# Patient Record
Sex: Female | Born: 1990 | Race: White | Hispanic: No | Marital: Single | Smoking: Current every day smoker
Health system: Southern US, Community
[De-identification: ages and names within clinical notes are randomized; demographics above are authoritative.]

## PROBLEM LIST (undated history)

## (undated) HISTORY — PX: CHOLECYSTECTOMY: SHX55

---
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
## 2010-03-04 ENCOUNTER — Emergency Department (HOSPITAL_COMMUNITY): Admission: EM | Admit: 2010-03-04 | Discharge: 2010-03-05 | Payer: Self-pay | Admitting: Emergency Medicine

## 2010-05-24 ENCOUNTER — Emergency Department (HOSPITAL_COMMUNITY): Admission: EM | Admit: 2010-05-24 | Discharge: 2010-05-24 | Payer: Self-pay | Admitting: Emergency Medicine

## 2010-09-03 ENCOUNTER — Encounter: Payer: Self-pay | Admitting: Family Medicine

## 2010-10-27 LAB — URINALYSIS, ROUTINE W REFLEX MICROSCOPIC
Hgb urine dipstick: NEGATIVE
Urobilinogen, UA: 0.2 mg/dL (ref 0.0–1.0)

## 2010-10-27 LAB — POCT PREGNANCY, URINE: Preg Test, Ur: NEGATIVE

## 2010-10-27 LAB — WET PREP, GENITAL
Clue Cells Wet Prep HPF POC: NONE SEEN
Trich, Wet Prep: NONE SEEN
Yeast Wet Prep HPF POC: NONE SEEN

## 2010-10-27 LAB — GC/CHLAMYDIA PROBE AMP, GENITAL: Chlamydia, DNA Probe: NEGATIVE

## 2010-10-29 LAB — URINALYSIS, ROUTINE W REFLEX MICROSCOPIC
Bilirubin Urine: NEGATIVE
Glucose, UA: NEGATIVE mg/dL
Leukocytes, UA: NEGATIVE
Nitrite: NEGATIVE
Protein, ur: NEGATIVE mg/dL
Specific Gravity, Urine: >1.03 — ABNORMAL HIGH (ref 1.005–1.030)
Urobilinogen, UA: 0.2 mg/dL (ref 0.0–1.0)
pH: 6 (ref 5.0–8.0)

## 2010-10-29 LAB — COMPREHENSIVE METABOLIC PANEL
ALT: 15 U/L (ref 0–35)
AST: 19 U/L (ref 0–37)
Albumin: 4 g/dL (ref 3.5–5.2)
Alkaline Phosphatase: 105 U/L (ref 39–117)
BUN: 5 mg/dL — ABNORMAL LOW (ref 6–23)
CO2: 27 mEq/L (ref 19–32)
Calcium: 9.3 mg/dL (ref 8.4–10.5)
Chloride: 103 mEq/L (ref 96–112)
Creatinine, Ser: 0.78 mg/dL (ref 0.4–1.2)
GFR calc Af Amer: >60 mL/min (ref >60)
GFR calc non Af Amer: >60 mL/min (ref >60)
Glucose, Bld: 86 mg/dL (ref 70–99)
Potassium: 3.7 mEq/L (ref 3.5–5.1)
Sodium: 137 mEq/L (ref 135–145)
Total Bilirubin: 0.4 mg/dL (ref 0.3–1.2)
Total Protein: 6.7 g/dL (ref 6.0–8.3)

## 2010-10-29 LAB — URINE MICROSCOPIC-ADD ON

## 2010-10-29 LAB — CBC
HCT: 38.7 % (ref 36.0–46.0)
MCHC: 34.6 g/dL (ref 30.0–36.0)
RBC: 4.31 MIL/uL (ref 3.87–5.11)

## 2010-10-29 LAB — DIFFERENTIAL
Basophils Absolute: 0.1 10*3/uL (ref 0.0–0.1)
Basophils Relative: 1 % (ref 0–1)
Eosinophils Absolute: 0.4 10*3/uL (ref 0.0–0.7)
Lymphocytes Relative: 32 % (ref 12–46)
Lymphs Abs: 3.6 10*3/uL (ref 0.7–4.0)
Monocytes Absolute: 0.6 10*3/uL (ref 0.1–1.0)
Neutro Abs: 6.6 10*3/uL (ref 1.7–7.7)

## 2010-10-29 LAB — POCT PREGNANCY, URINE: Preg Test, Ur: NEGATIVE

## 2010-10-29 LAB — LIPASE, BLOOD: Lipase: 24 U/L (ref 11–59)

## 2010-11-12 ENCOUNTER — Emergency Department (HOSPITAL_COMMUNITY)
Admission: EM | Admit: 2010-11-12 | Discharge: 2010-11-12 | Disposition: A | Discharge status: Home / Self Care | Payer: Medicaid Other | Attending: Emergency Medicine | Admitting: Emergency Medicine

## 2010-11-12 DIAGNOSIS — O209 Hemorrhage in early pregnancy, unspecified: Secondary | ICD-10-CM | POA: Insufficient documentation

## 2010-11-12 LAB — URINALYSIS, ROUTINE W REFLEX MICROSCOPIC
Bilirubin Urine: NEGATIVE
Glucose, UA: NEGATIVE mg/dL
Hgb urine dipstick: NEGATIVE
Ketones, ur: NEGATIVE mg/dL
Nitrite: NEGATIVE
Protein, ur: NEGATIVE mg/dL
Specific Gravity, Urine: <1.005 — ABNORMAL LOW (ref 1.005–1.030)
Urobilinogen, UA: 0.2 mg/dL (ref 0.0–1.0)
pH: 6 (ref 5.0–8.0)
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
## 2011-05-17 LAB — URINALYSIS, ROUTINE W REFLEX MICROSCOPIC
Glucose, UA: NEGATIVE
Hgb urine dipstick: NEGATIVE
Nitrite: NEGATIVE
Protein, ur: NEGATIVE

## 2011-05-17 LAB — BASIC METABOLIC PANEL
BUN: 11
CO2: 25
Creatinine, Ser: 0.77
Potassium: 3.8
Sodium: 135

## 2011-05-17 LAB — GC/CHLAMYDIA PROBE AMP, GENITAL
Chlamydia, DNA Probe: NEGATIVE
GC Probe Amp, Genital: NEGATIVE

## 2011-05-17 LAB — CBC
Hemoglobin: 13.8
Platelets: 227

## 2011-05-17 LAB — WET PREP, GENITAL

## 2011-05-17 LAB — RPR: RPR Ser Ql: NONREACTIVE

## 2011-05-17 LAB — PREGNANCY, URINE: Preg Test, Ur: NEGATIVE

## 2011-05-17 LAB — DIFFERENTIAL
Basophils Absolute: 0
Eosinophils Relative: 1
Neutrophils Relative %: 55

## 2012-01-16 IMAGING — CT CT ABD-PELV W/ CM
2 of 3 series · 16 of 46 positions shown, 18 images · IV contrast (Omnipaque 300)
Comparison: 04/14/2008.

CLINICAL DATA: .  nausea vomiting.  Weakness and diarrhea.

CT ABDOMEN AND PELVIS WITH CONTRAST
TECHNIQUE: Multidetector CT imaging of the abdomen and pelvis was
performed following the standard protocol during bolus
administration of intravenous contrast.
Contrast: 100 ml Zmnipaque-ESS

[Series 2: abd_pel_with 5.0 b40f · axial · 0.54mm/px · z∈[+282,+672]mm · 13 of 90 slices shown, 15 images]
[im 6/90  soft-tissue]
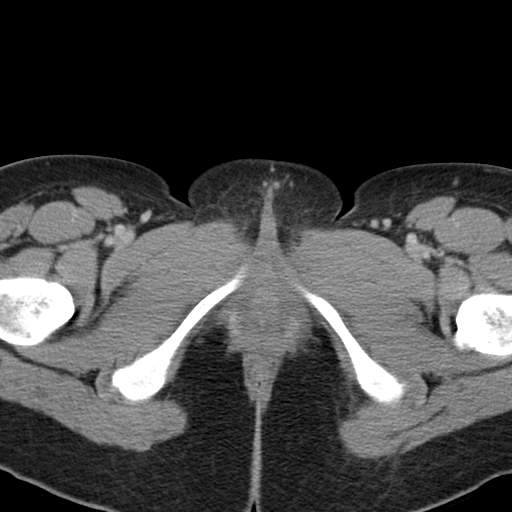
[im 6/90  bone]
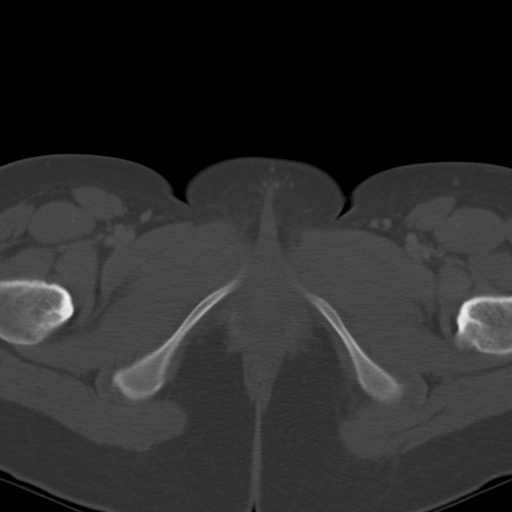
[im 12/90  soft-tissue]
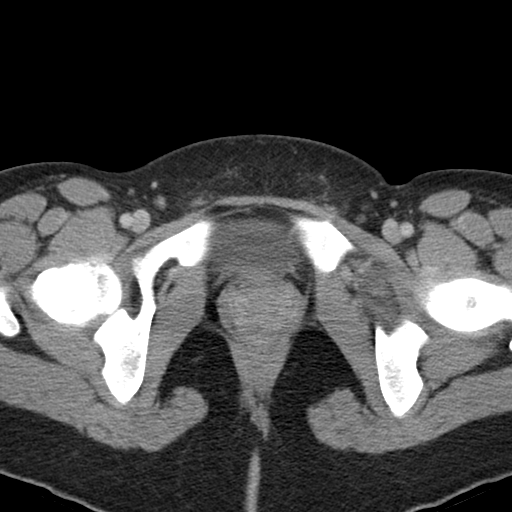
[im 18/90  soft-tissue]
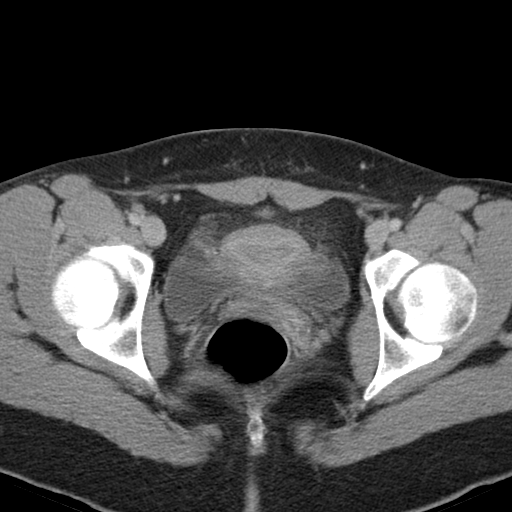
[im 26/90  soft-tissue]
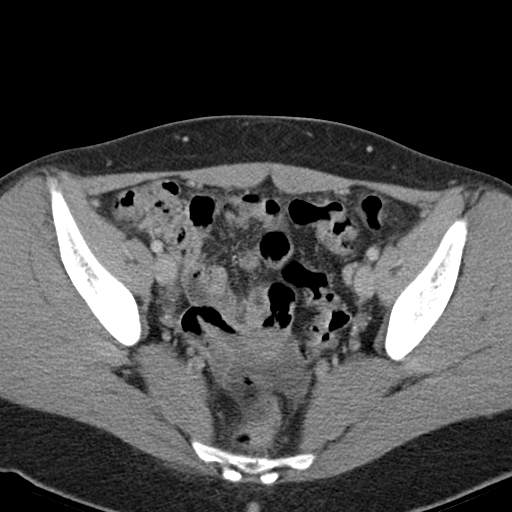
[im 32/90  soft-tissue]
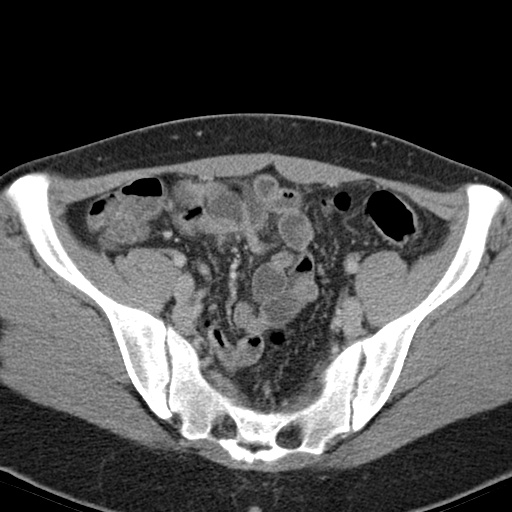
[im 38/90  soft-tissue]
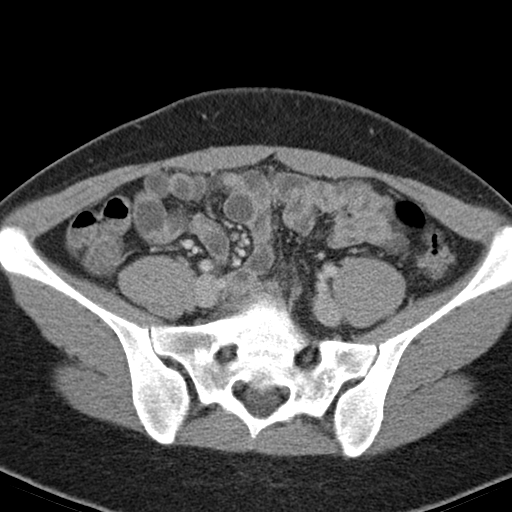
[im 46/90  soft-tissue]
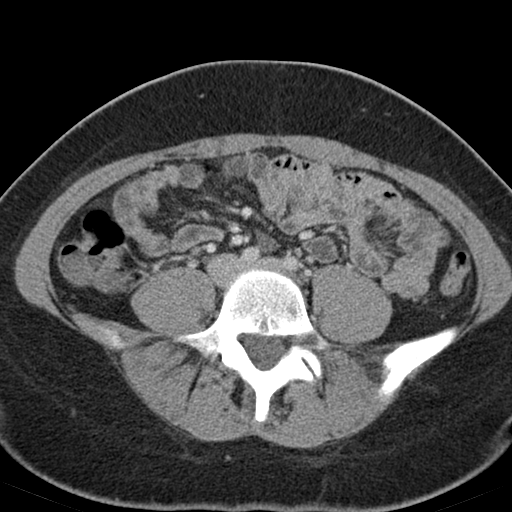
[im 52/90  soft-tissue]
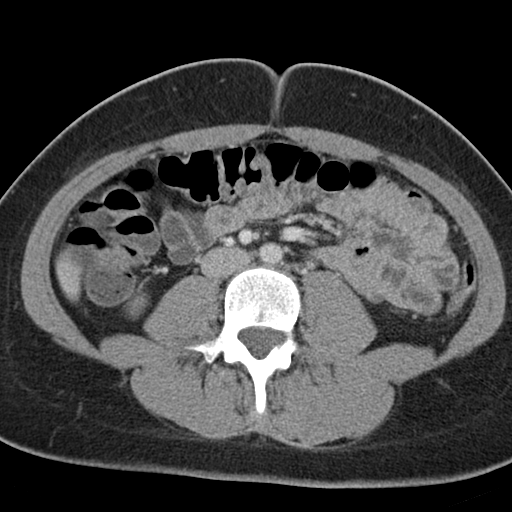
[im 58/90  soft-tissue]
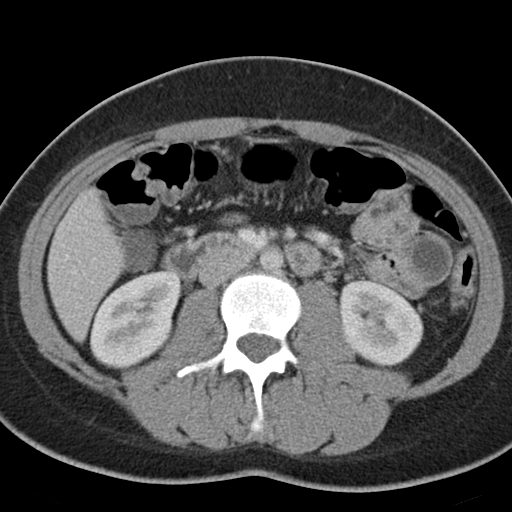
[im 58/90  bone]
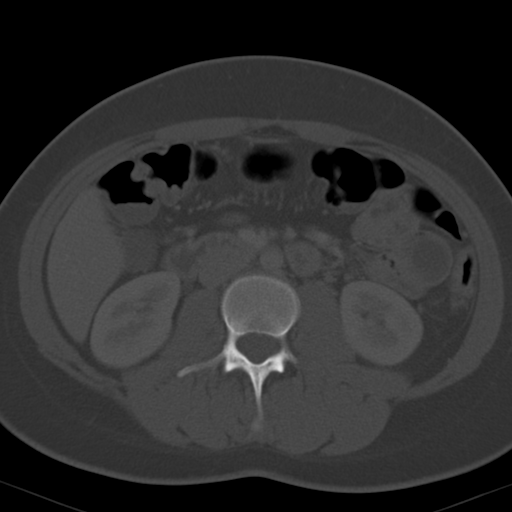
[im 64/90  soft-tissue]
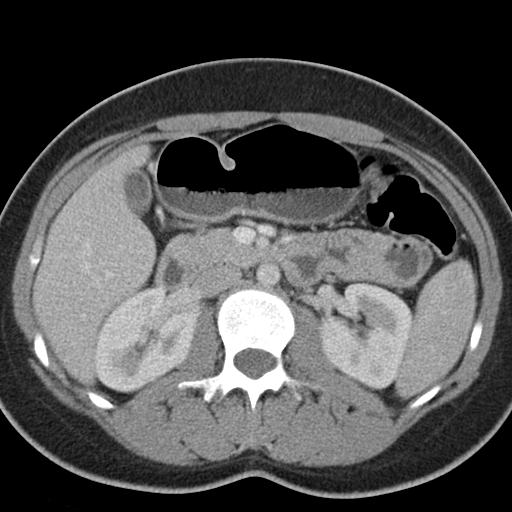
[im 72/90  soft-tissue]
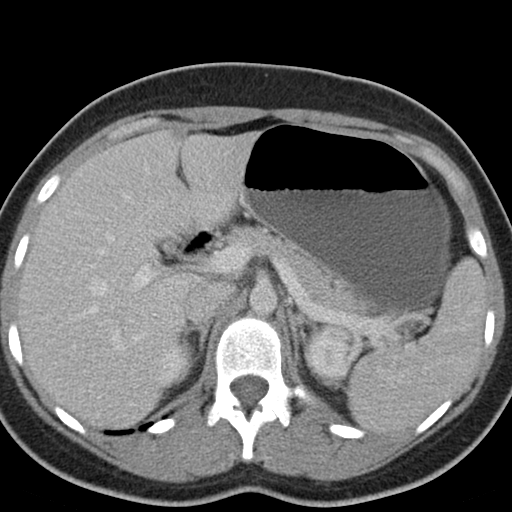
[im 78/90  soft-tissue]
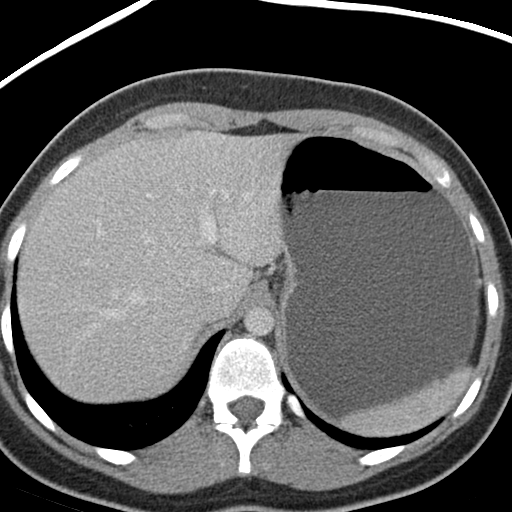
[im 84/90  soft-tissue]
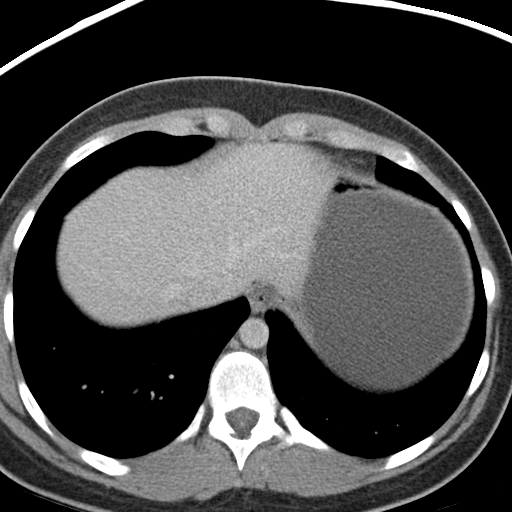

[Series 4: abd_pel_with 3.0 spo cor · coronal · 0.59mm/px · 3 of 83 slices shown]
[im 28/83  soft-tissue]
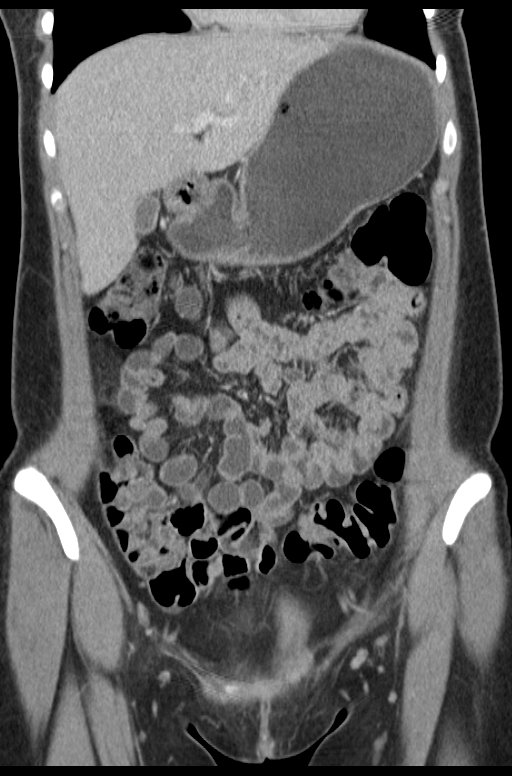
[im 37/83  soft-tissue]
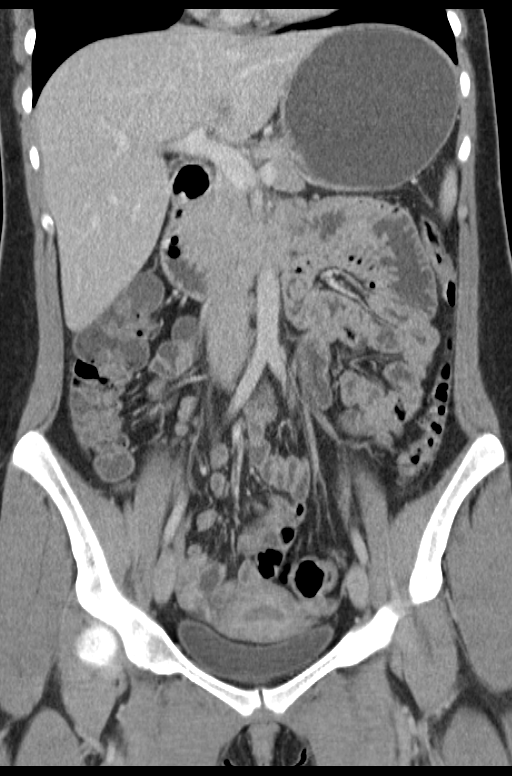
[im 46/83  soft-tissue]
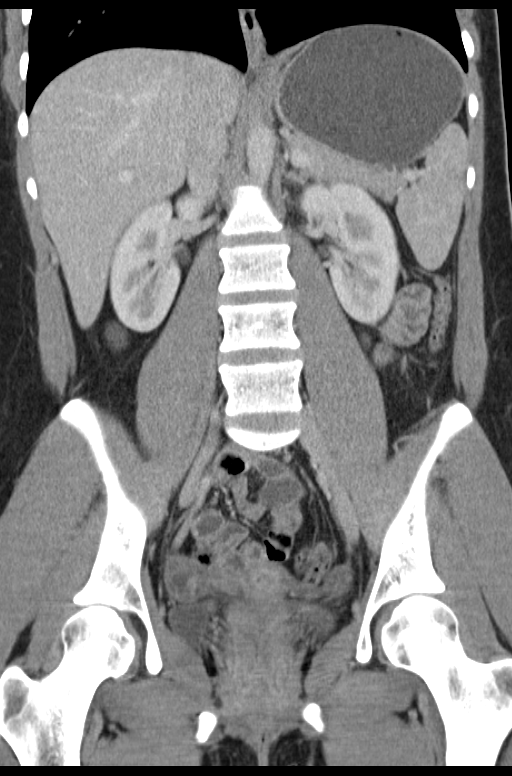

[16 of 46 positions shown; findings below may reference images not displayed]

FINDINGS: No focal abnormalities seen in the liver or spleen.  The
stomach is distended with water.  The duodenum, pancreas,
gallbladder, adrenal glands, and kidneys have normal imaging
features.

No abdominal aortic aneurysm.  No abdominal lymphadenopathy.  No
free fluid in the abdomen.

Imaging through the pelvis shows no evidence for lymphadenopathy.
There is no pelvic free fluid.  Bladder is unremarkable.  The
uterus has normal imaging features.  16 mm dominant follicle is
seen in the right ovary.  Left ovary is normal.

The colon is normal, without evidence for wall thickening or
pericolonic edema/inflammation.  No fluid in the left colon to
suggest diarrhea.  The terminal ileum is normal.  The appendix is
normal

Bone windows reveal no worrisome lytic or sclerotic osseous
lesions.
IMPRESSION: No acute findings.  No CT evidence to explain the patient's history
of nausea and vomiting.

## 2017-05-24 ENCOUNTER — Encounter (HOSPITAL_COMMUNITY): Payer: Self-pay | Admitting: Emergency Medicine

## 2017-05-24 ENCOUNTER — Emergency Department (HOSPITAL_COMMUNITY)
Admission: EM | Admit: 2017-05-24 | Discharge: 2017-05-24 | Disposition: A | Discharge status: Home / Self Care | Payer: Medicaid - Out of State | Attending: Emergency Medicine | Admitting: Emergency Medicine

## 2017-05-24 DIAGNOSIS — F17200 Nicotine dependence, unspecified, uncomplicated: Secondary | ICD-10-CM | POA: Insufficient documentation

## 2017-05-24 DIAGNOSIS — Z79899 Other long term (current) drug therapy: Secondary | ICD-10-CM | POA: Diagnosis not present

## 2017-05-24 DIAGNOSIS — M792 Neuralgia and neuritis, unspecified: Secondary | ICD-10-CM | POA: Insufficient documentation

## 2017-05-24 DIAGNOSIS — R0789 Other chest pain: Secondary | ICD-10-CM | POA: Diagnosis present

## 2017-05-24 MED ORDER — PREDNISONE 50 MG PO TABS
60.0000 mg | ORAL_TABLET | Freq: Once | ORAL | Status: AC
  Administered 2017-05-24: 60 mg via ORAL
  Filled 2017-05-24: qty 1

## 2017-05-24 MED ORDER — PREDNISONE 20 MG PO TABS: 40.0000 mg | ORAL_TABLET | Freq: Every day | ORAL | 0 refills | Status: AC

## 2017-05-24 MED ORDER — OXYCODONE-ACETAMINOPHEN 5-325 MG PO TABS
1.0000 | ORAL_TABLET | Freq: Once | ORAL | Status: AC
  Administered 2017-05-24: 1 via ORAL
  Filled 2017-05-24: qty 1

## 2017-05-24 NOTE — ED Triage Notes (Signed)
Pt states she was sent her by pcp for right breast pain that radiates into back x one week. Pt states her wbc was elevated.

## 2017-05-24 NOTE — ED Notes (Signed)
Patient educated about not driving or performing other critical tasks (such as operating heavy machinery, caring for infant/toddler/child) due to sedative nature of narcotic medications received while in the ED.  Pt/caregiver verbalized understanding.   

## 2017-05-26 NOTE — ED Provider Notes (Signed)
[facility]-EMERGENCY DEPT Provider Note   CSN: [id number] Arrival date & time: 05/24/17  1910     History   Chief Complaint Chief Complaint  Patient presents with  . Abnormal Lab    HPI Ashley Khan is a 26 y.o. female.  HPI Patient is a 26 year old female presents the emergency department right lateral chest and right upper thoracic discomfort.  She states she was seen and evaluated by a medical provider in urgent care and sent her to the emergency department for elevated white blood cell count.  Patient reports the pain is a burning sensation.  Denies weakness of her arms or legs.  No chest pain shortness breath.  Reportedly there is a chest x-ray done as an outpatient today which she was told was normal.  No pain with deep breathing.  No other complaints.  Denies recent heavy lifting.   History reviewed. No pertinent past medical history.  There are no active problems to display for this patient.   Past Surgical History:  Procedure Laterality Date  . CHOLECYSTECTOMY      OB History    No data available       Home Medications    Prior to Admission medications   Medication Sig Start Date End Date Taking? Authorizing Provider  acetaminophen (TYLENOL) 500 MG tablet Take 500 mg by mouth every 6 (six) hours as needed for mild pain or moderate pain.   Yes [provider]  amitriptyline (ELAVIL) 25 MG tablet Take 25 mg by mouth at bedtime. 05/21/17  Yes [provider]  clindamycin (CLEOCIN) 300 MG capsule Take 300 mg by mouth 3 (three) times daily. 10 DAY COURSE STARTING ON 05/17/2017 05/17/17  Yes [provider]  KARIVA 0.15-0.02/0.01 MG (21/5) tablet Take 1 tablet by mouth daily. 05/11/17  Yes [provider]  lamoTRIgine (LAMICTAL) 150 MG tablet Take 150 mg by mouth 2 (two) times daily with a meal. 05/11/17  Yes [provider]  OLANZapine (ZYPREXA) 10 MG tablet TAKE 1 TABLET BY MOUTH EVERYDAY AT BEDTIME 05/11/17  Yes [provider]  sertraline (ZOLOFT) 100 MG tablet TAKE 1 & 1/2 TABLET BY MOUTH EVERY DAY 05/11/17  Yes [provider]  SUMAtriptan (IMITREX) 100 MG tablet TAKE 1 TABLET (100 MG) BY MOUTH 2 TIMES PER DAY AT LEAST 2 HOURS BETWEEN DOSES AS NEEDED 04/11/17  Yes [provider]  VENTOLIN HFA 108 (90 Base) MCG/ACT inhaler INHALE 2 PUFFS BY MOUTH EVERY 4 HOURS AS NEEDED FOR WHEEZING 04/28/17  Yes [provider]  predniSONE (DELTASONE) 20 MG tablet Take 2 tablets (40 mg total) by mouth daily. 05/24/17 05/29/17  Azalia Bilis, MD    Family History No family history on file.  Social History Social History  Substance Use Topics  . Smoking status: Current Every Day Smoker  . Smokeless tobacco: Never Used  . Alcohol use No     Allergies   Prozac [fluoxetine]   Review of Systems Review of Systems  All other systems reviewed and are negative.    Physical Exam Updated Vital Signs BP 107/76   Pulse 78   Temp 98.8 F (37.1 C) (Oral)   Resp 18   Ht  (1.676 m)   Wt 101.2 kg (223 lb)   LMP 05/01/2017   SpO2 99%   BMI 35.99 kg/m   Physical Exam  Constitutional: She is oriented to person, place, and time. She appears well-developed and well-nourished. No distress.  HENT:  Head: Normocephalic and  atraumatic.  Eyes: EOM are normal.  Neck: Normal range of motion.  Cardiovascular: Normal rate, regular rhythm and normal heart sounds.   Pulmonary/Chest: Effort normal and breath sounds normal.  Abdominal: Soft. She exhibits no distension. There is no tenderness.  Musculoskeletal: Normal range of motion.  Right lateral chest wall is normal without rash.  No bruising.  No crepitus.  Neurological: She is alert and oriented to person, place, and time.  Skin: Skin is warm and dry.  Psychiatric: She has a normal mood and affect. Judgment normal.  Nursing note and vitals reviewed.    ED Treatments / Results  Labs (all labs ordered are listed, but only abnormal  results are displayed) Labs Reviewed - No data to display  EKG  EKG Interpretation None       Radiology No results found.  Procedures Procedures (including critical care time)  Medications Ordered in ED Medications  predniSONE (DELTASONE) tablet 60 mg (60 mg Oral Given 05/24/17 2126)  oxyCODONE-acetaminophen (PERCOCET/ROXICET) 5-325 MG per tablet 1 tablet (1 tablet Oral Given 05/24/17 2126)     Initial Impression / Assessment and Plan / ED Course  I have reviewed the triage vital signs and the nursing notes.  Pertinent labs & imaging results that were available during my care of the patient were reviewed by me and considered in my medical decision making (see chart for details).     Overall well-appearing.  Nonspecific pain in the right side of her chest and upper back.  This seems dermatomal nature.  No signs of zoster at this time.  Pain seems to be more neuropathic in nature  Final Clinical Impressions(s) / ED Diagnoses   Final diagnoses:  Neuropathic pain    New Prescriptions Discharge Medication List as of 05/24/2017  9:40 PM    START taking these medications   Details  predniSONE (DELTASONE) 20 MG tablet Take 2 tablets (40 mg total) by mouth daily., Starting Thu 05/24/2017, Until Tue 05/29/2017, Print         Azalia Bilis, MD 05/26/17 [phone]
